# Patient Record
Sex: Female | Born: 2005
Health system: Southern US, Community
[De-identification: ages and names within clinical notes are randomized; demographics above are authoritative.]

## PROBLEM LIST (undated history)

## (undated) DIAGNOSIS — H6690 Otitis media, unspecified, unspecified ear: Secondary | ICD-10-CM

## (undated) HISTORY — DX: Otitis media, unspecified, unspecified ear: H66.90

## (undated) HISTORY — PX: TYMPANOSTOMY TUBE PLACEMENT: SHX32

---
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
## 2023-01-14 ENCOUNTER — Telehealth: Payer: Self-pay | Admitting: Pediatrics

## 2023-01-14 DIAGNOSIS — L700 Acne vulgaris: Secondary | ICD-10-CM

## 2023-01-14 NOTE — Telephone Encounter (Signed)
Brittney Mckenzie needs a new referral for dermatology for acne treatment. [facility] is not currently taking ner referrals. Will refer to [facility].

## 2023-01-17 NOTE — Telephone Encounter (Signed)
Referral has been placed in epic 

## 2023-03-31 ENCOUNTER — Ambulatory Visit (INDEPENDENT_AMBULATORY_CARE_PROVIDER_SITE_OTHER): Payer: 59 | Admitting: Pediatrics

## 2023-03-31 ENCOUNTER — Encounter: Payer: Self-pay | Admitting: Pediatrics

## 2023-03-31 VITALS — BP 102/58 | Ht 64.0 in | Wt 98.0 lb

## 2023-03-31 DIAGNOSIS — R634 Abnormal weight loss: Secondary | ICD-10-CM | POA: Diagnosis not present

## 2023-03-31 DIAGNOSIS — Z68.41 Body mass index (BMI) pediatric, less than 5th percentile for age: Secondary | ICD-10-CM

## 2023-03-31 DIAGNOSIS — Z1339 Encounter for screening examination for other mental health and behavioral disorders: Secondary | ICD-10-CM

## 2023-03-31 DIAGNOSIS — Z23 Encounter for immunization: Secondary | ICD-10-CM

## 2023-03-31 DIAGNOSIS — Z00129 Encounter for routine child health examination without abnormal findings: Secondary | ICD-10-CM

## 2023-03-31 DIAGNOSIS — Z0001 Encounter for general adult medical examination with abnormal findings: Secondary | ICD-10-CM | POA: Diagnosis not present

## 2023-03-31 NOTE — Progress Notes (Signed)
Adolescent Well Care Visit Brittney Mckenzie is a 17 y.o. female who is here for well care.    PCP:  Estelle June, NP. Seeing patient today as she needed to get fit in before going to college.   History was provided by the patient.  Confidentiality was discussed with the patient and, if applicable, with caregiver as well.  Current Issues: Current concerns include: abnormal weight loss- has history of eating disorder. Patient has lost 13 lbs since last visit in January.   Nutrition: Nutrition/Eating Behaviors: good- eating balanced diet. States she's been eating a lot of smoothie bowls recently Adequate calcium in diet?: yes Supplements/ Vitamins: yes  Exercise/ Media: Play any Sports?/ Exercise: yes- yoga Screen Time:  < 2 hours Media Rules or Monitoring?: yes  Sleep:  Sleep: >8 hours - no trouble falling asleep or staying asleep  Social Screening: Lives with:  parents Parental relations:  good Activities, Work, and Regulatory affairs officer?: school Concerns regarding behavior with peers?  no Stressors of note: no  Education:   School Grade: going into freshman year of college at [employer], plans to major in business School performance: doing well; no concerns School Behavior: doing well; no concerns   Confidential Social History: Tobacco?  no Secondhand smoke exposure?  no Drugs/ETOH?  no  Sexually Active?  Yes Pregnancy Prevention: IUD, got it replaced a few months ago  Safe at home, in school & in relationships?  Yes Safe to self?  Yes   Screenings: Patient has a dental home: yes  The following were discussed: eating habits, exercise habits, safety equipment use, bullying, abuse and/or trauma, weapon use, tobacco use, other substance use, reproductive health, and mental health.  Issues were addressed and counseling provided.    Additional topics were addressed as anticipatory guidance.  PHQ-9 completed and results indicated no risks  Physical Exam:  Vitals:   03/31/23 0956   BP: (!) 102/58  Weight: 98 lb (44.5 kg)  Height: 5\' 4"  (1.626 m)   BP (!) 102/58   Ht 5\' 4"  (1.626 m)   Wt 98 lb (44.5 kg)   BMI 16.82 kg/m  Body mass index: body mass index is 16.82 kg/m. Blood pressure %iles are not available for patients who are 18 years or older.  Hearing Screening   500Hz  1000Hz  2000Hz  3000Hz  4000Hz   Right ear 20 20 20 20 20   Left ear 20 20 20 20 20    Vision Screening   Right eye Left eye Both eyes  Without correction 10/10 10/10   With correction      General Appearance:   alert, oriented, no acute distress  HENT: Normocephalic, no obvious abnormality, conjunctiva clear  Mouth:   Normal appearing teeth, no obvious discoloration, dental caries, or dental caps  Neck:   Supple; thyroid: no enlargement, symmetric, no tenderness/mass/nodules  Chest normal  Lungs:   Clear to auscultation bilaterally, normal work of breathing  Heart:   Regular rate and rhythm, S1 and S2 normal, no murmurs;   Abdomen:   Soft, non-tender, no mass, or organomegaly  GU normal female  Musculoskeletal:   Tone and strength strong and symmetrical, all extremities               Lymphatic:   No cervical adenopathy  Skin/Hair/Nails:   Skin warm, dry and intact, no rashes, no bruises or petechiae  Neurologic:   Strength, gait, and coordination normal and age-appropriate     Assessment and Plan:   Well adolescent female BMI  is appropriate for age - 1% due to recent weight loss, discussed this with patient and with patient's mother via phone  No labs drawn today regarding weight loss -- will discuss further with mother and patient before decisions made on next steps  Hearing screening result:normal Vision screening result: normal   Orders Placed This Encounter  Procedures   Meningococcal B, OMV (Bexsero)   Men B vaccine per orders. Indications, contraindications and side effects of vaccine/vaccines discussed with parent and parent verbally expressed understanding and also  agreed with the administration of vaccine/vaccines as ordered above today.Handout (VIS) given for each vaccine at this visit.  Return in about 1 year (around 03/30/2024).Harrell Gave, NP

## 2023-03-31 NOTE — Patient Instructions (Signed)
Preventive Care 18-19 Years Old, Female Preventive care refers to lifestyle choices and visits with your health care provider that can promote health and wellness. At this stage in your life, you may start seeing a primary care physician instead of a pediatrician for your preventive care. Preventive care visits are also called wellness exams. What can I expect for my preventive care visit? Counseling During your preventive care visit, your health care provider may ask about your: Medical history, including: Past medical problems. Family medical history. Pregnancy history. Current health, including: Menstrual cycle. Method of birth control. Emotional well-being. Home life and relationship well-being. Sexual activity and sexual health. Lifestyle, including: Alcohol, nicotine or tobacco, and drug use. Access to firearms. Diet, exercise, and sleep habits. Sunscreen use. Motor vehicle safety. Physical exam Your health care provider may check your: Height and weight. These may be used to calculate your BMI (body mass index). BMI is a measurement that tells if you are at a healthy weight. Waist circumference. This measures the distance around your waistline. This measurement also tells if you are at a healthy weight and may help predict your risk of certain diseases, such as type 2 diabetes and high blood pressure. Heart rate and blood pressure. Body temperature. Skin for abnormal spots. Breasts. What immunizations do I need?  Vaccines are usually given at various ages, according to a schedule. Your health care provider will recommend vaccines for you based on your age, medical history, and lifestyle or other factors, such as travel or where you work. What tests do I need? Screening Your health care provider may recommend screening tests for certain conditions. This may include: Vision and hearing tests. Lipid and cholesterol levels. Pelvic exam and Pap test. Hepatitis B  test. Hepatitis C test. HIV (human immunodeficiency virus) test. STI (sexually transmitted infection) testing, if you are at risk. Tuberculosis skin test if you have symptoms. BRCA-related cancer screening. This may be done if you have a family history of breast, ovarian, tubal, or peritoneal cancers. Talk with your health care provider about your test results, treatment options, and if necessary, the need for more tests. Follow these instructions at home: Eating and drinking  Eat a healthy diet that includes fresh fruits and vegetables, whole grains, lean protein, and low-fat dairy products. Drink enough fluid to keep your urine pale yellow. Do not drink alcohol if: Your health care provider tells you not to drink. You are pregnant, may be pregnant, or are planning to become pregnant. You are under the legal drinking age. In the [place] the legal drinking age is 21. If you drink alcohol: Limit how much you have to 0-1 drink a day. Know how much alcohol is in your drink. In the [place] one drink equals one 12 oz bottle of beer (355 mL), one 5 oz glass of wine (148 mL), or one 1 oz glass of hard liquor (44 mL). Lifestyle Brush your teeth every morning and night with fluoride toothpaste. Floss one time each day. Exercise for at least 30 minutes 5 or more days of the week. Do not use any products that contain nicotine or tobacco. These products include cigarettes, chewing tobacco, and vaping devices, such as e-cigarettes. If you need help quitting, ask your health care provider. Do not use drugs. If you are sexually active, practice safe sex. Use a condom or other form of protection to prevent STIs. If you do not wish to become pregnant, use a form of birth control. If you plan to become pregnant,   see your health care provider for a prepregnancy visit. Find healthy ways to manage stress, such as: Meditation, yoga, or listening to music. Journaling. Talking to a trusted person. Spending time  with friends and family. Safety Always wear your seat belt while driving or riding in a vehicle. Do not drive: If you have been drinking alcohol. Do not ride with someone who has been drinking. When you are tired or distracted. While texting. If you have been using any mind-altering substances or drugs. Wear a helmet and other protective equipment during sports activities. If you have firearms in your house, make sure you follow all gun safety procedures. Seek help if you have been bullied, physically abused, or sexually abused. Use the internet responsibly to avoid dangers, such as online bullying and online sex predators. What's next? Go to your health care provider once a year for an annual wellness visit. Ask your health care provider how often you should have your eyes and teeth checked. Stay up to date on all vaccines. This information is not intended to replace advice given to you by your health care provider. Make sure you discuss any questions you have with your health care provider. Document Revised: 03/18/2021 Document Reviewed: 03/18/2021 Elsevier Patient Education  2024 [employer].  

## 2023-06-07 ENCOUNTER — Other Ambulatory Visit: Payer: Self-pay | Admitting: Pediatrics

## 2023-06-07 MED ORDER — CLINDAMYCIN PHOSPHATE 1 % EX SWAB: 1.0000 | Freq: Two times a day (BID) | CUTANEOUS | 6 refills | Status: DC

## 2023-06-22 IMAGING — US US PELVIS COMPLETE
1 series · 14 of 25 positions shown · non-contrast
Comparison: None.

CLINICAL DATA: Breakthrough bleeding with IUD.

EXAM:
TRANSABDOMINAL ULTRASOUND OF PELVIS
TECHNIQUE: Transabdominal ultrasound examination of the pelvis was performed
including evaluation of the uterus, ovaries, adnexal regions, and
pelvic cul-de-sac.

[Series 1: us pelvis complete · 0.23mm/px · 38 acquisitions, 14 frames shown]
[im 1/38]
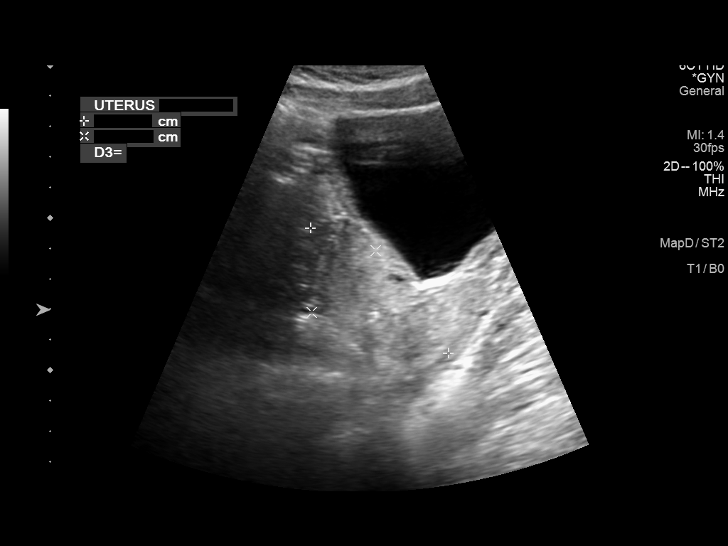
[im 4/38]
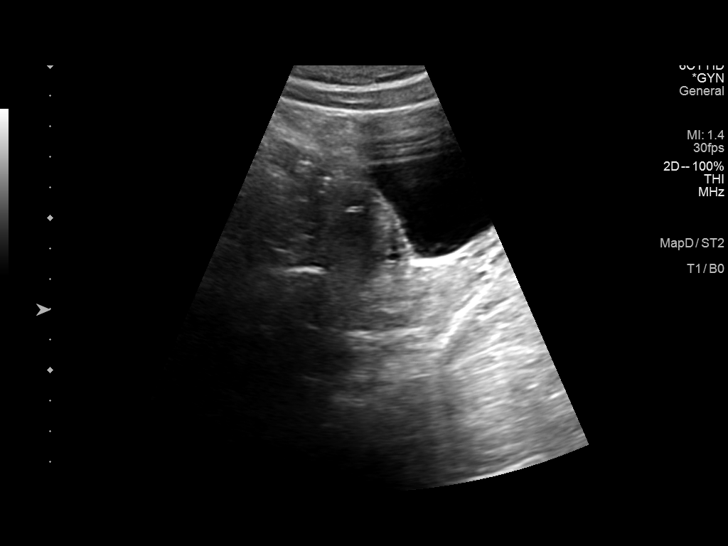
[im 7/38]
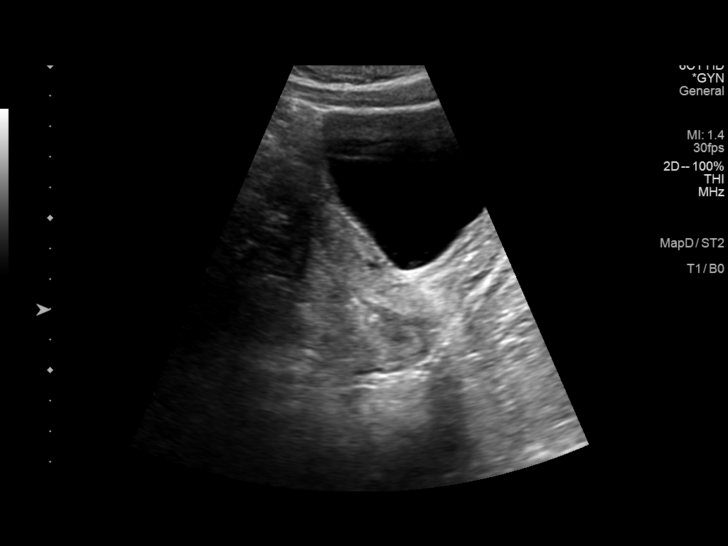
[im 10/38]
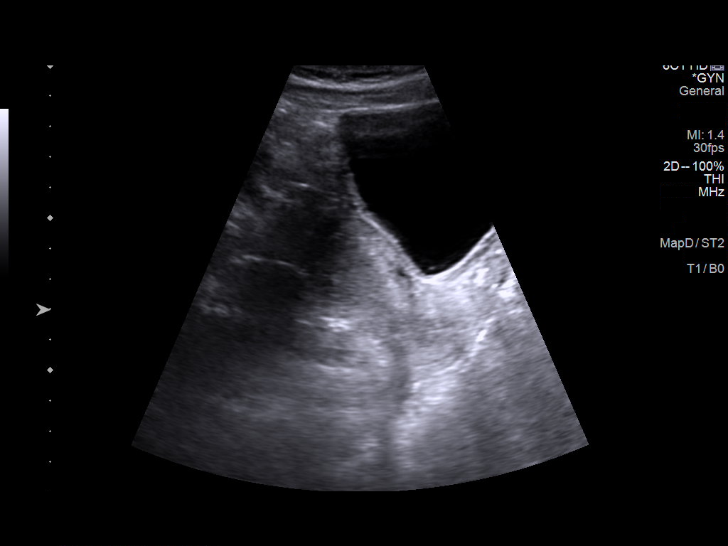
[im 13/38]
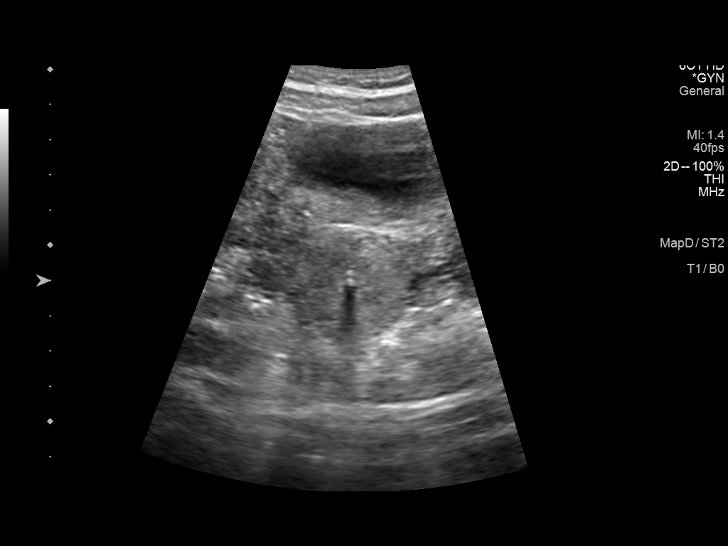
[im 14/38]
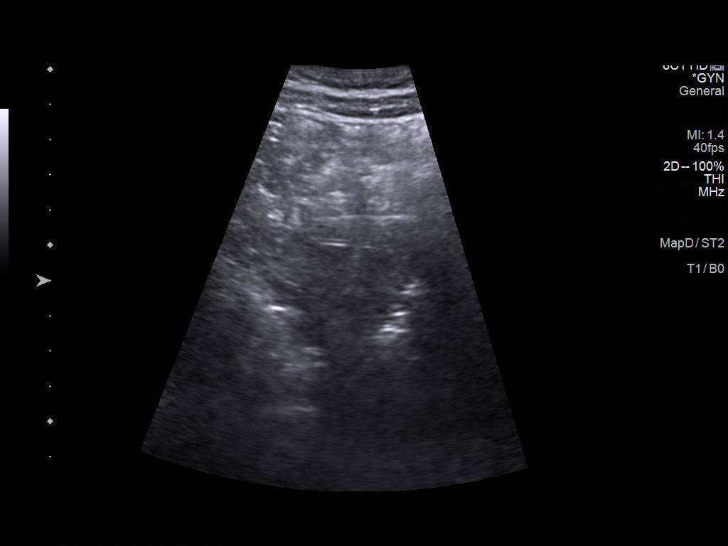
[im 17/38]
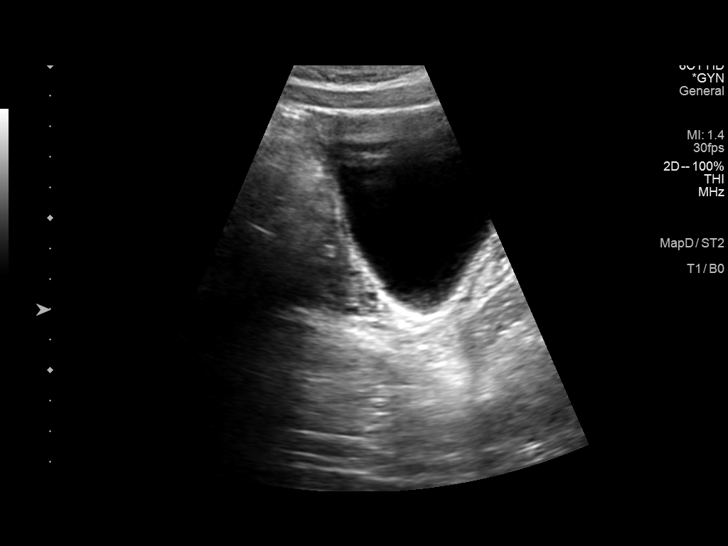
[im 21/38]
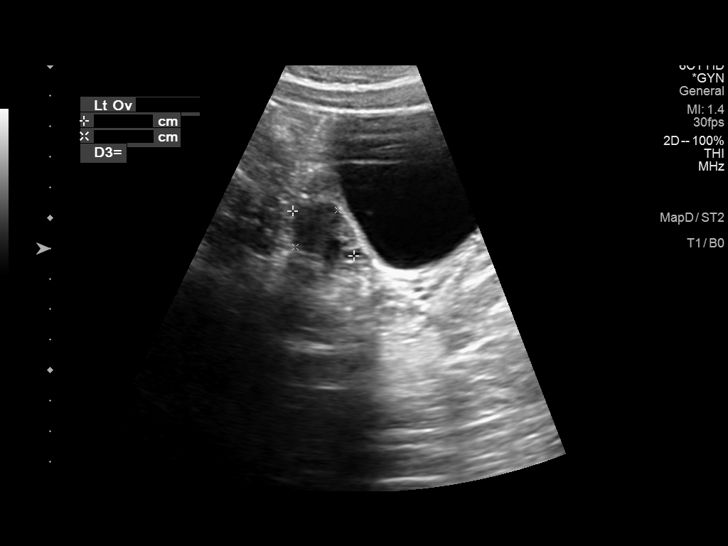
[im 24/38]
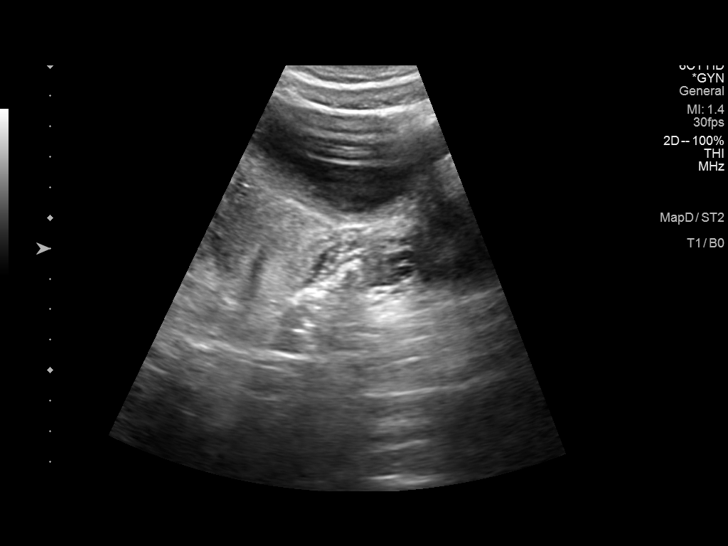
[im 25/38]
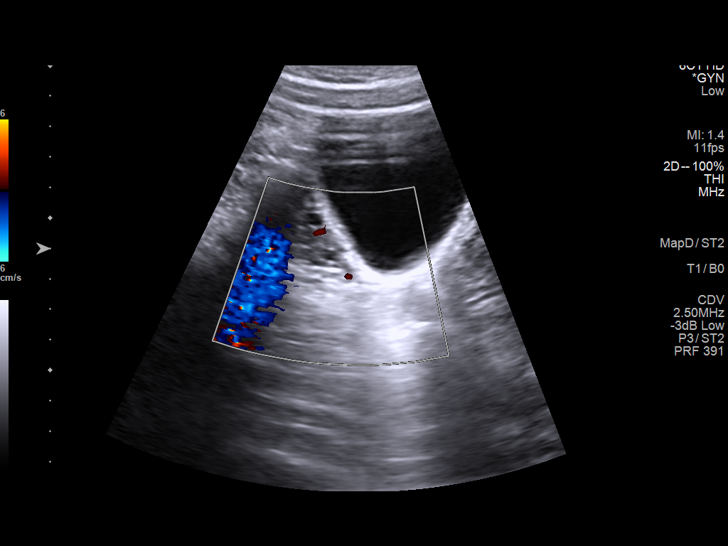
[im 28/38]
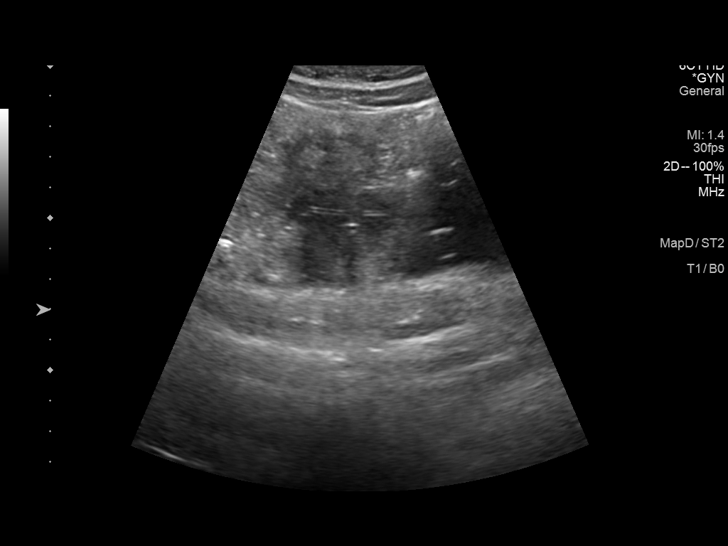
[im 31/38]
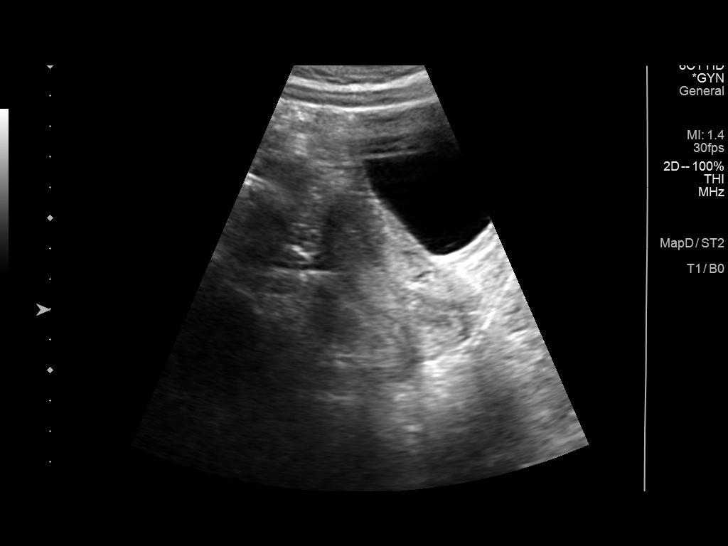
[im 34/38]
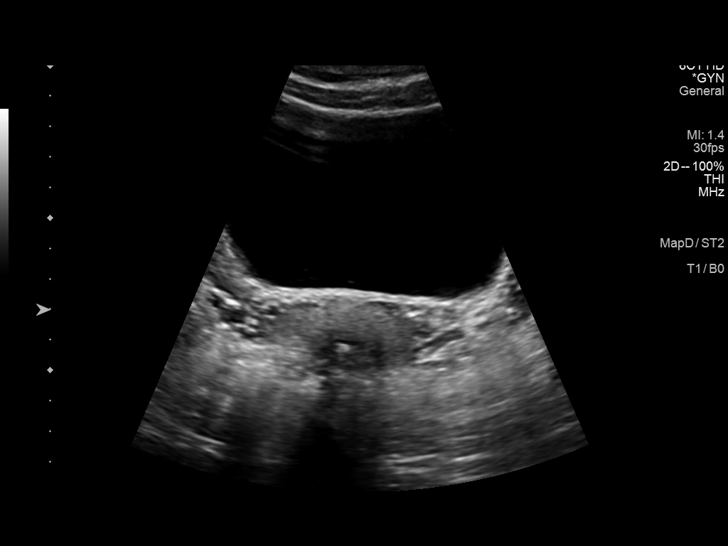
[im 38/38]
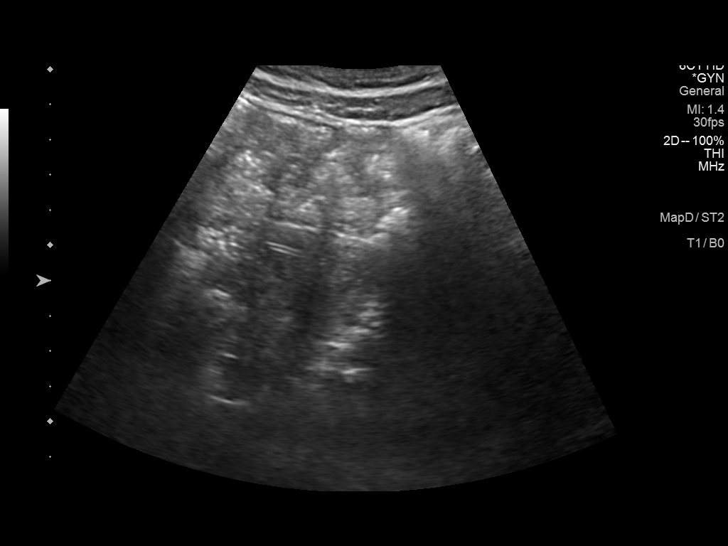

[14 of 25 positions shown; findings below may reference images not displayed]

FINDINGS: Uterus

Measurements: 6.1 x 2.9 x 4.0 cm = volume: 36.9 mL. No fibroids or
other mass visualized.

Endometrium

Thickness: 3.8 mm. The patient's IUD extends from the fundus into
the body of the uterus. The IUD is in grossly normal position.

Right ovary

Measurements: 2.9 x 1.6 x 2.4 cm = volume: 5.6 mL. Normal
appearance/no adnexal mass.

Left ovary

Measurements: 2.5 x 1.9 x 1.8 cm = volume: 4.2 mL. Normal
appearance/no adnexal mass.

Other findings:  No abnormal free fluid.
IMPRESSION: 1. The IUD is grossly in adequate position extending from the
uterine fundus into the body.
2. The uterus, endometrium, and ovaries are otherwise unremarkable.

## 2023-06-30 ENCOUNTER — Other Ambulatory Visit: Payer: Self-pay | Admitting: Pediatrics

## 2023-06-30 NOTE — Progress Notes (Signed)
Letter for school written.

## 2023-08-14 ENCOUNTER — Other Ambulatory Visit: Payer: Self-pay | Admitting: Pediatrics

## 2023-08-14 MED ORDER — BENZONATATE 100 MG PO CAPS: 100.0000 mg | ORAL_CAPSULE | Freq: Four times a day (QID) | ORAL | 2 refills | Status: AC | PRN

## 2023-08-14 MED ORDER — AZITHROMYCIN 250 MG PO TABS: ORAL_TABLET | ORAL | 0 refills | Status: DC

## 2023-09-19 ENCOUNTER — Other Ambulatory Visit (HOSPITAL_COMMUNITY): Payer: Self-pay

## 2023-09-19 ENCOUNTER — Ambulatory Visit: Payer: 59 | Admitting: Dermatology

## 2023-09-19 ENCOUNTER — Encounter: Payer: Self-pay | Admitting: Dermatology

## 2023-09-19 DIAGNOSIS — L709 Acne, unspecified: Secondary | ICD-10-CM | POA: Diagnosis not present

## 2023-09-19 DIAGNOSIS — L700 Acne vulgaris: Secondary | ICD-10-CM

## 2023-09-19 DIAGNOSIS — W891XXA Exposure to tanning bed, initial encounter: Secondary | ICD-10-CM | POA: Diagnosis not present

## 2023-09-19 MED ORDER — DOXYCYCLINE HYCLATE 100 MG PO TABS
100.0000 mg | ORAL_TABLET | Freq: Two times a day (BID) | ORAL | 0 refills | Status: AC
  Filled 2023-09-19: qty 10, 5d supply, fill #0

## 2023-09-19 MED ORDER — CLINDAMYCIN PHOSPHATE 1 % EX SWAB
1.0000 | Freq: Two times a day (BID) | CUTANEOUS | 4 refills | Status: DC
  Filled 2023-09-19: qty 60, 30d supply, fill #0

## 2023-09-19 NOTE — Patient Instructions (Addendum)
Hello Ms. Brittney Mckenzie,  Thank you for visiting [name] today. We appreciate your dedication to enhancing your skin health. Below is a summary of the essential instructions from today's consultation:  - Clindamycin Swabs: Continue using every other night as part of your skincare routine.  - Adapalene Gel:   - Usage: Start applying two nights a week to manage acne effectively without causing dryness.   - Application: Apply a thin layer of moisturizer, then adapalene, followed by a thicker layer of moisturizer.  - Hyaluronic Acid Serum:   - Usage: To hydrate your skin, apply it before the adapalene on treatment nights and alone on non-treatment nights.  - Doxycycline:   - Usage: In the event of a severe acne flare, take prescribed doxycycline twice a day for five days.   - Instructions: Consume with a substantial meal to avoid stomach upset.  - Niacinamide: Incorporate into your routine either in the morning or at night to aid in hydration and reducing dark spots.  - Sunscreen: Daily use of a moisturizer with sunscreen is recommended for anti-aging and cancer prevention. A sample of La Roche-Posay Double Repair with UV protection has been provided.  - Precautions:   - Avoid mixing adapalene with salicylic acid or benzoyl peroxide to prevent excessive dryness.   - Discontinue adapalene a week before any waxing appointments and avoid direct sunlight exposure on treated areas.  - Prescriptions Refilled: Your clindamycin swabs and doxycycline prescription have been refilled and are ready for pickup at your local [facility].  Please feel free to reach out through MyChart messaging if you have any further questions or need additional guidance. We look forward to seeing you during your summer break to reassess and possibly enhance your treatment plan.  Best regards,  Dr. [facility]       Important Information  Due to recent changes in healthcare laws, you may see results of your  pathology and/or laboratory studies on MyChart before the doctors have had a chance to review them. We understand that in some cases there may be results that are confusing or concerning to you. Please understand that not all results are received at the same time and often the doctors may need to interpret multiple results in order to provide you with the best plan of care or course of treatment. Therefore, we ask that you please give [name] 2 business days to thoroughly review all your results before contacting the office for clarification. Should we see a critical lab result, you will be contacted sooner.   If You Need Anything After Your Visit  If you have any questions or concerns for your doctor, please call our main line at [phone] If no one answers, please leave a voicemail as directed and we will return your call as soon as possible. Messages left after 4 pm will be answered the following business day.   You may also send [name] a message via MyChart. We typically respond to MyChart messages within 1-2 business days.  For prescription refills, please ask your pharmacy to contact our office. Our fax number is [phone].  If you have an urgent issue when the clinic is closed that cannot wait until the next business day, you can page your doctor at the number below.    Please note that while we do our best to be available for urgent issues outside of office hours, we are not available 24/7.   If you have an urgent issue and are unable to reach  [name], you may choose to seek medical care at your doctor's office, retail clinic, urgent care center, or emergency room.  If you have a medical emergency, please immediately call 911 or go to the emergency department. In the event of inclement weather, please call our main line at [phone] for an update on the status of any delays or closures.  Dermatology Medication Tips: Please keep the boxes that topical medications come in in order to help keep  track of the instructions about where and how to use these. Pharmacies typically print the medication instructions only on the boxes and not directly on the medication tubes.   If your medication is too expensive, please contact our office at [phone] or send [name] a message through MyChart.   We are unable to tell what your co-pay for medications will be in advance as this is different depending on your insurance coverage. However, we may be able to find a substitute medication at lower cost or fill out paperwork to get insurance to cover a needed medication.   If a prior authorization is required to get your medication covered by your insurance company, please allow [name] 1-2 business days to complete this process.  Drug prices often vary depending on where the prescription is filled and some pharmacies may offer cheaper prices.  The website www.goodrx.com contains coupons for medications through different pharmacies. The prices here do not account for what the cost may be with help from insurance (it may be cheaper with your insurance), but the website can give you the price if you did not use any insurance.  - You can print the associated coupon and take it with your prescription to the pharmacy.  - You may also stop by our office during regular business hours and pick up a GoodRx coupon card.  - If you need your prescription sent electronically to a different pharmacy, notify our office through [facility] or by phone at [phone]

## 2023-09-19 NOTE — Progress Notes (Signed)
   New Patient Visit   Subjective  Brittney Mckenzie is a 17 y.o. female who presents for the following: New Pt - Acne  Patient states she has acne located at the face that she would like to have examined. Patient reports the areas have been there since 6 grade. She reports the areas are not bothersome. Patient rates irritation 0 out of 10. She states that the areas have not spread. Patient reports she has previously been treated for these areas. She was previously on Accutane but stopped March 2023. She is currently using clindamycin swabs daily. Patient denies Hx of bx. Patient denies family history of skin cancer(s).  The patient has spots, moles and lesions to be evaluated, some may be new or changing and the patient may have concern these could be cancer.   The following portions of the chart were reviewed this encounter and updated as appropriate: medications, allergies, medical history  Review of Systems:  No other skin or systemic complaints except as noted in HPI or Assessment and Plan.  Objective  Well appearing patient in no apparent distress; mood and affect are within normal limits.   A focused examination was performed of the following areas: acne   Relevant exam findings are noted in the Assessment and Plan.           Assessment & Plan   Acne - Assessment: An 17 year old female with a history of acne, previously treated with Accutane (finished March 2023), experienced a flare-up of acne on the lower jawline after starting college and replacing her IUD, suggesting a hormonal influence. The patient has been using clindamycin swabs for about two months, showing significant improvement in her skin condition. - Plan:   - Continue clindamycin swabs as needed.   - Initiate adapalene 0.1% gel, to be applied 2-3 nights per week.   - Use hyaluronic acid serum before applying adapalene.   - Apply cream moisturizer after adapalene if needed.   - Prescribe doxycycline for acute  flares (20 tablets, to be taken twice daily for 5 days with food).   - Use niacinamide serum for hydration and evening skin tone.   - Apply daily moisturizer with SPF.   - Avoid mixing adapalene with salicylic acid or benzoyl peroxide.   - Follow up during summer break.   - Consider tretinoin in the future if needed.  Current tanning Bed Usage The nature of sun-induced photo-aging and skin cancers is discussed.  Sun avoidance, protective clothing, and the use of 30-SPF sunscreens is advised. Observe closely for skin damage/changes, and call if such occurs.    No follow-ups on file.    Documentation: I have reviewed the above documentation for accuracy and completeness, and I agree with the above.  I, Shirron Marcha Solders, CMA, am acting as scribe for [employer], DO.   Langston Reusing, DO

## 2023-09-29 ENCOUNTER — Other Ambulatory Visit (HOSPITAL_COMMUNITY): Payer: Self-pay

## 2023-11-15 ENCOUNTER — Other Ambulatory Visit: Payer: Self-pay | Admitting: Pediatrics

## 2023-11-15 DIAGNOSIS — L700 Acne vulgaris: Secondary | ICD-10-CM

## 2023-11-15 MED ORDER — CLINDAMYCIN PHOSPHATE 1 % EX SWAB: 1.0000 | Freq: Two times a day (BID) | CUTANEOUS | 4 refills | Status: DC

## 2023-12-09 ENCOUNTER — Other Ambulatory Visit: Payer: Self-pay | Admitting: Pediatrics

## 2023-12-09 MED ORDER — OSELTAMIVIR PHOSPHATE 75 MG PO CAPS: 75.0000 mg | ORAL_CAPSULE | Freq: Two times a day (BID) | ORAL | 0 refills | Status: AC

## 2023-12-09 NOTE — Progress Notes (Signed)
 Flu like symptoms started today. Known exposure from roommate.

## 2024-03-20 ENCOUNTER — Ambulatory Visit: Payer: 59 | Admitting: Dermatology

## 2024-04-13 ENCOUNTER — Other Ambulatory Visit: Payer: Self-pay | Admitting: Pediatrics

## 2024-04-13 ENCOUNTER — Other Ambulatory Visit (HOSPITAL_COMMUNITY): Payer: Self-pay

## 2024-04-13 DIAGNOSIS — L700 Acne vulgaris: Secondary | ICD-10-CM

## 2024-04-13 MED ORDER — CLINDAMYCIN PHOSPHATE 1 % EX SWAB
1.0000 | Freq: Two times a day (BID) | CUTANEOUS | 4 refills | Status: DC
  Filled 2024-04-13: qty 60, 30d supply, fill #0

## 2024-04-13 MED ORDER — ADAPALENE 0.3 % EX GEL
1.0000 | Freq: Every day | CUTANEOUS | 3 refills | Status: DC
  Filled 2024-04-13: qty 45, 30d supply, fill #0

## 2024-04-16 ENCOUNTER — Other Ambulatory Visit: Payer: Self-pay

## 2024-04-23 ENCOUNTER — Other Ambulatory Visit (HOSPITAL_COMMUNITY): Payer: Self-pay

## 2024-04-24 ENCOUNTER — Other Ambulatory Visit (HOSPITAL_COMMUNITY): Payer: Self-pay

## 2024-04-26 ENCOUNTER — Other Ambulatory Visit: Payer: Self-pay | Admitting: Pediatrics

## 2024-04-26 ENCOUNTER — Other Ambulatory Visit: Payer: Self-pay

## 2024-04-26 ENCOUNTER — Other Ambulatory Visit (HOSPITAL_COMMUNITY): Payer: Self-pay

## 2024-04-26 DIAGNOSIS — L700 Acne vulgaris: Secondary | ICD-10-CM

## 2024-04-26 MED ORDER — ADAPALENE 0.3 % EX GEL
1.0000 | Freq: Every day | CUTANEOUS | 3 refills | Status: AC
  Filled 2024-04-26: qty 45, 30d supply, fill #0
  Filled 2024-04-26: qty 45, fill #0
  Filled 2024-04-27: qty 45, 30d supply, fill #0

## 2024-04-26 MED ORDER — AZITHROMYCIN 500 MG PO TABS
1000.0000 mg | ORAL_TABLET | Freq: Once | ORAL | 0 refills | Status: AC
  Filled 2024-04-26 – 2024-04-27 (×3): qty 2, 1d supply, fill #0

## 2024-04-26 MED ORDER — CLINDAMYCIN PHOSPHATE 1 % EX SWAB
1.0000 | Freq: Two times a day (BID) | CUTANEOUS | 4 refills | Status: AC
  Filled 2024-04-26 – 2024-04-27 (×3): qty 60, 30d supply, fill #0

## 2024-04-27 ENCOUNTER — Other Ambulatory Visit (HOSPITAL_COMMUNITY): Payer: Self-pay

## 2024-04-27 ENCOUNTER — Other Ambulatory Visit: Payer: Self-pay

## 2024-06-12 DIAGNOSIS — Z113 Encounter for screening for infections with a predominantly sexual mode of transmission: Secondary | ICD-10-CM | POA: Diagnosis not present

## 2024-06-12 DIAGNOSIS — L700 Acne vulgaris: Secondary | ICD-10-CM | POA: Diagnosis not present

## 2024-06-12 DIAGNOSIS — Z7689 Persons encountering health services in other specified circumstances: Secondary | ICD-10-CM | POA: Diagnosis not present
